# Patient Record
Sex: Female | Born: 1960 | Race: White | Hispanic: No | Marital: Married | Smoking: Never smoker
Health system: Southern US, Community
[De-identification: ages and names within clinical notes are randomized; demographics above are authoritative.]

## PROBLEM LIST (undated history)

## (undated) DIAGNOSIS — I10 Essential (primary) hypertension: Secondary | ICD-10-CM

## (undated) DIAGNOSIS — C801 Malignant (primary) neoplasm, unspecified: Secondary | ICD-10-CM

---

## 2019-11-06 ENCOUNTER — Other Ambulatory Visit: Payer: Self-pay | Admitting: Internal Medicine

## 2019-11-06 DIAGNOSIS — R1319 Other dysphagia: Secondary | ICD-10-CM

## 2019-11-06 DIAGNOSIS — R1310 Dysphagia, unspecified: Secondary | ICD-10-CM

## 2019-11-13 ENCOUNTER — Other Ambulatory Visit: Payer: Self-pay

## 2019-11-13 ENCOUNTER — Ambulatory Visit
Admission: RE | Admit: 2019-11-13 | Discharge: 2019-11-13 | Disposition: A | Discharge status: Home / Self Care | Payer: BC Managed Care – PPO | Source: Ambulatory Visit | Attending: Internal Medicine | Admitting: Internal Medicine

## 2019-11-13 DIAGNOSIS — R1319 Other dysphagia: Secondary | ICD-10-CM

## 2019-11-13 DIAGNOSIS — R1310 Dysphagia, unspecified: Secondary | ICD-10-CM | POA: Insufficient documentation

## 2019-12-17 ENCOUNTER — Other Ambulatory Visit: Payer: Self-pay | Admitting: Internal Medicine

## 2019-12-17 DIAGNOSIS — Z1231 Encounter for screening mammogram for malignant neoplasm of breast: Secondary | ICD-10-CM

## 2020-01-04 LAB — COLOGUARD: COLOGUARD: POSITIVE — AB

## 2020-05-16 ENCOUNTER — Inpatient Hospital Stay: Admission: RE | Admit: 2020-05-16 | Payer: BC Managed Care – PPO | Source: Ambulatory Visit

## 2020-05-18 ENCOUNTER — Encounter: Admission: RE | Payer: Self-pay | Source: Home / Self Care

## 2020-05-18 ENCOUNTER — Ambulatory Visit
Admission: RE | Admit: 2020-05-18 | Payer: BC Managed Care – PPO | Source: Home / Self Care | Admitting: Internal Medicine

## 2020-05-18 SURGERY — COLONOSCOPY WITH PROPOFOL
Anesthesia: General

## 2020-06-30 ENCOUNTER — Other Ambulatory Visit: Payer: Self-pay | Admitting: Internal Medicine

## 2020-06-30 ENCOUNTER — Ambulatory Visit
Admission: RE | Admit: 2020-06-30 | Discharge: 2020-06-30 | Disposition: A | Discharge status: Home / Self Care | Payer: BC Managed Care – PPO | Source: Ambulatory Visit | Attending: Internal Medicine | Admitting: Internal Medicine

## 2020-06-30 ENCOUNTER — Other Ambulatory Visit: Payer: Self-pay

## 2020-06-30 DIAGNOSIS — R195 Other fecal abnormalities: Secondary | ICD-10-CM

## 2020-06-30 DIAGNOSIS — M7918 Myalgia, other site: Secondary | ICD-10-CM | POA: Diagnosis present

## 2020-06-30 DIAGNOSIS — R1011 Right upper quadrant pain: Secondary | ICD-10-CM | POA: Diagnosis present

## 2020-06-30 HISTORY — DX: Malignant (primary) neoplasm, unspecified: C80.1

## 2020-06-30 HISTORY — DX: Essential (primary) hypertension: I10

## 2020-06-30 LAB — POCT I-STAT CREATININE: Creatinine, Ser: 1 mg/dL (ref 0.44–1.00)

## 2020-06-30 MED ORDER — IOHEXOL 300 MG/ML  SOLN
80.0000 mL | Freq: Once | INTRAMUSCULAR | Status: AC | PRN
  Administered 2020-06-30: 80 mL via INTRAVENOUS

## 2020-08-19 ENCOUNTER — Other Ambulatory Visit (HOSPITAL_COMMUNITY): Payer: Self-pay | Admitting: Internal Medicine

## 2020-08-19 ENCOUNTER — Other Ambulatory Visit: Payer: Self-pay | Admitting: Internal Medicine

## 2020-08-19 DIAGNOSIS — R1011 Right upper quadrant pain: Secondary | ICD-10-CM

## 2020-10-20 ENCOUNTER — Other Ambulatory Visit: Payer: Self-pay

## 2020-10-20 ENCOUNTER — Ambulatory Visit
Admission: RE | Admit: 2020-10-20 | Discharge: 2020-10-20 | Disposition: A | Discharge status: Home / Self Care | Payer: BC Managed Care – PPO | Source: Ambulatory Visit | Attending: Internal Medicine | Admitting: Internal Medicine

## 2020-10-20 DIAGNOSIS — R1011 Right upper quadrant pain: Secondary | ICD-10-CM | POA: Insufficient documentation

## 2020-10-20 MED ORDER — TECHNETIUM TC 99M MEBROFENIN IV KIT
5.0000 | PACK | Freq: Once | INTRAVENOUS | Status: AC | PRN
  Administered 2020-10-20: 5.16 via INTRAVENOUS

## 2020-12-12 ENCOUNTER — Ambulatory Visit
Admission: RE | Admit: 2020-12-12 | Discharge: 2020-12-12 | Disposition: A | Discharge status: Home / Self Care | Payer: BC Managed Care – PPO | Source: Ambulatory Visit | Attending: Internal Medicine | Admitting: Internal Medicine

## 2020-12-12 ENCOUNTER — Other Ambulatory Visit: Payer: Self-pay

## 2020-12-12 DIAGNOSIS — Z1231 Encounter for screening mammogram for malignant neoplasm of breast: Secondary | ICD-10-CM | POA: Diagnosis present

## 2021-09-03 IMAGING — MG MM DIGITAL SCREENING BILAT W/ TOMO AND CAD
8 series · 8 of 24 positions shown · non-contrast
Comparison: Previous exam(s).

CLINICAL DATA: Screening.

EXAM:
DIGITAL SCREENING BILATERAL MAMMOGRAM WITH TOMOSYNTHESIS AND CAD
TECHNIQUE: Bilateral screening digital craniocaudal and mediolateral oblique
mammograms were obtained. Bilateral screening digital breast
tomosynthesis was performed. The images were evaluated with
computer-aided detection.

[R MLO synth-2D]
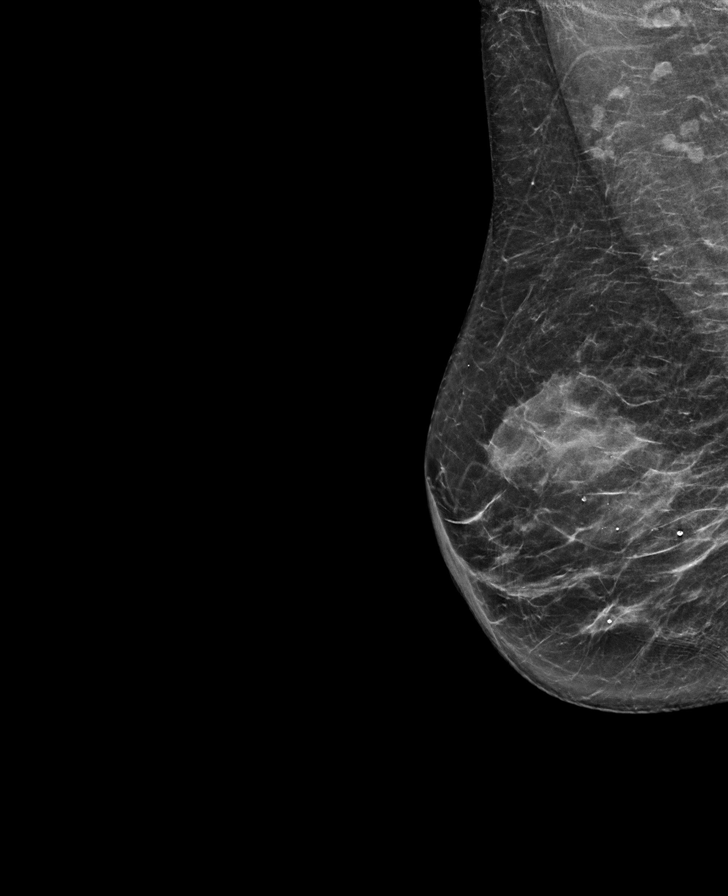

[R CC synth-2D]
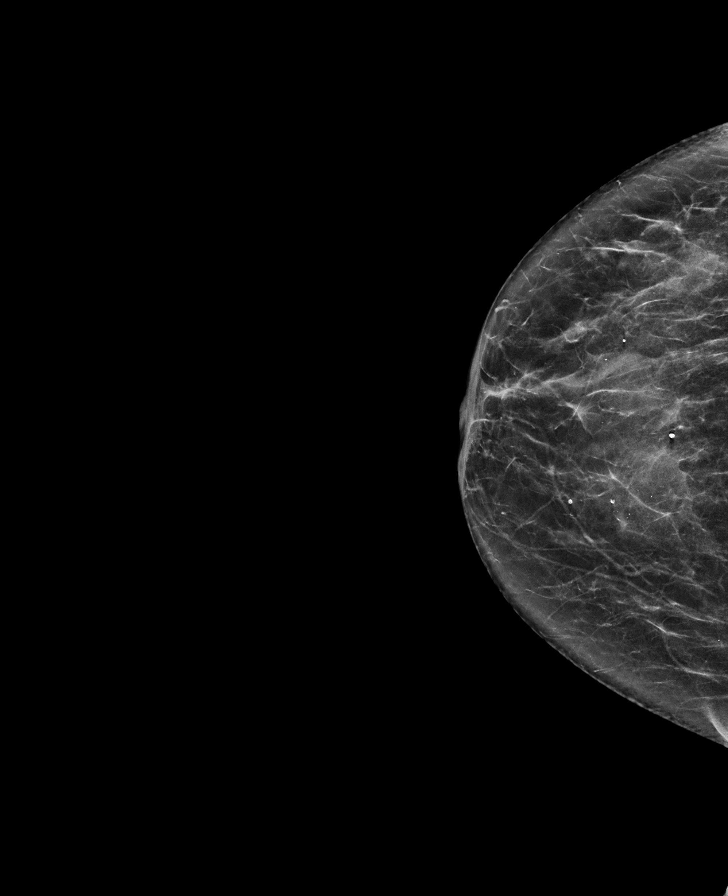

[L CC synth-2D]
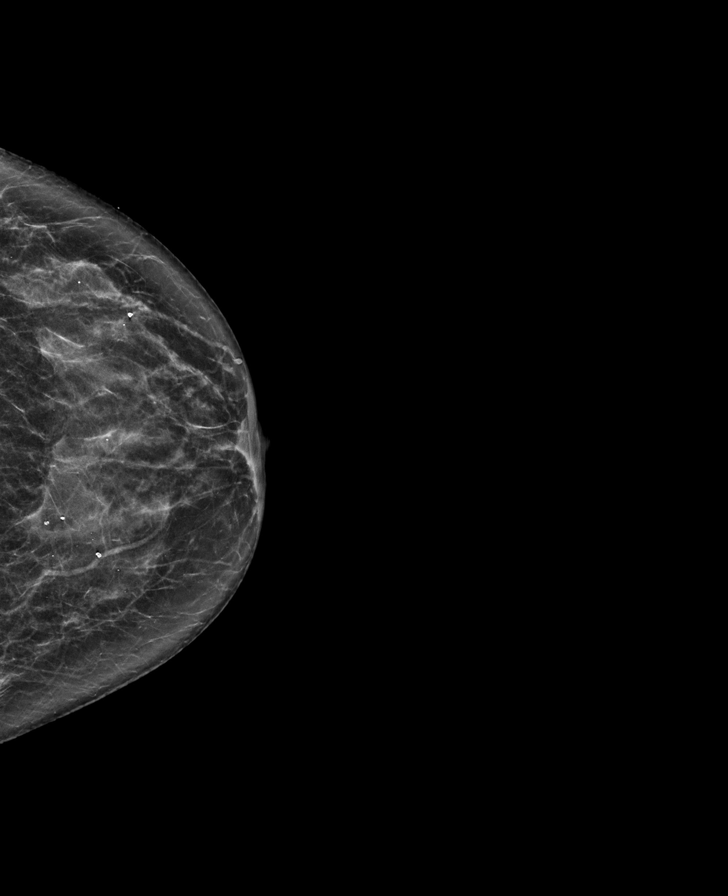

[L MLO synth-2D]
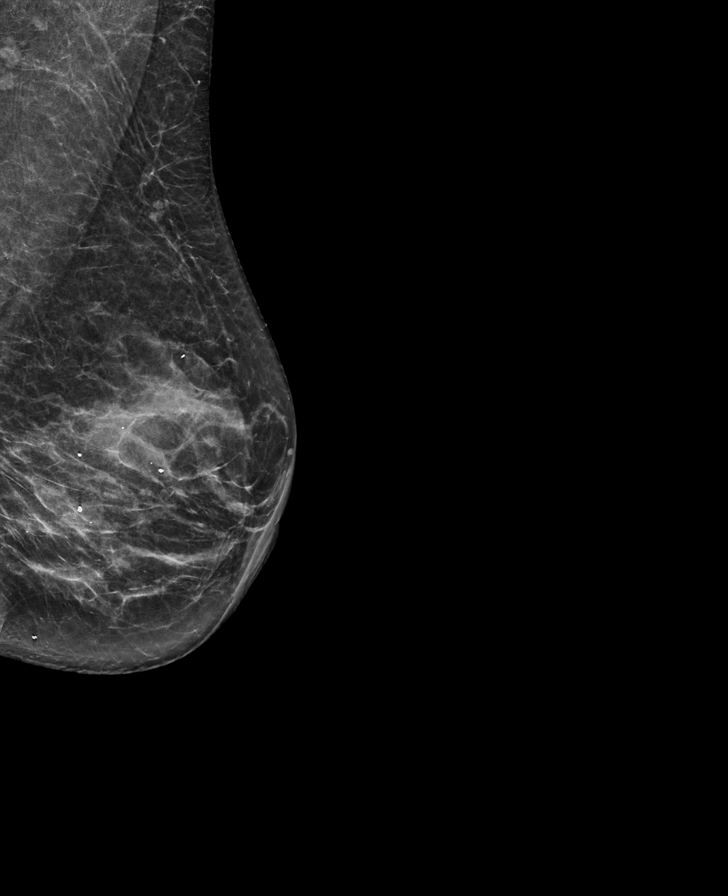

[R CC tomo · tomo slice 32/63.0]
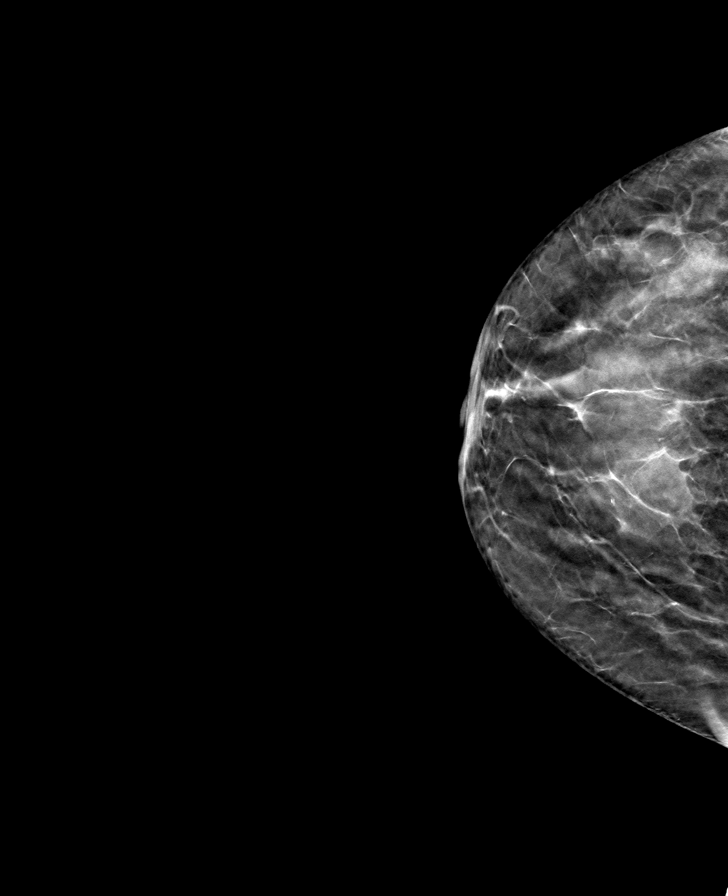

[L MLO tomo · tomo slice 33/66.0]
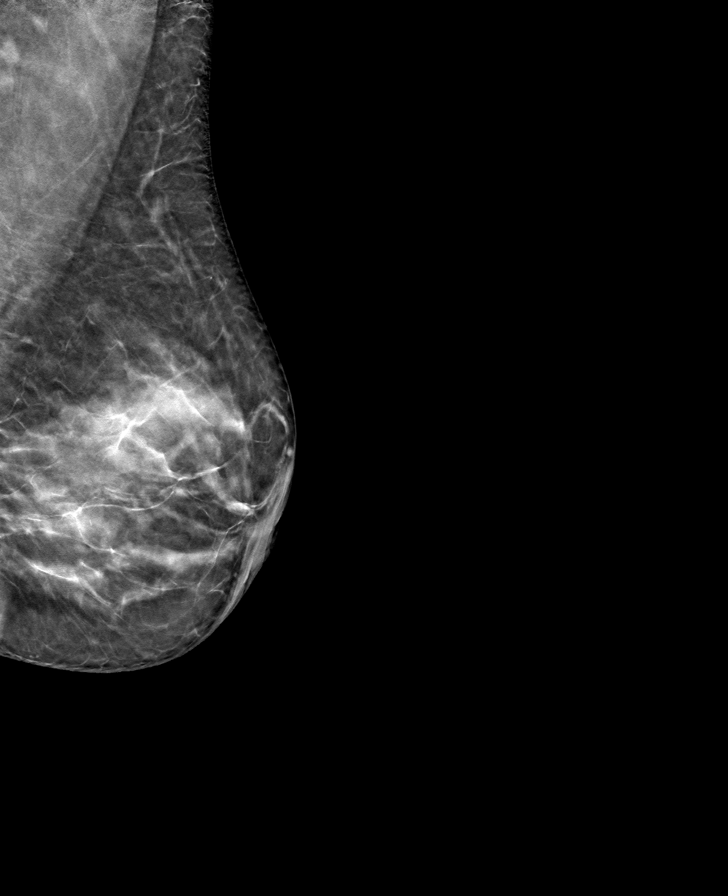

[R MLO tomo · tomo slice 35/69.0]
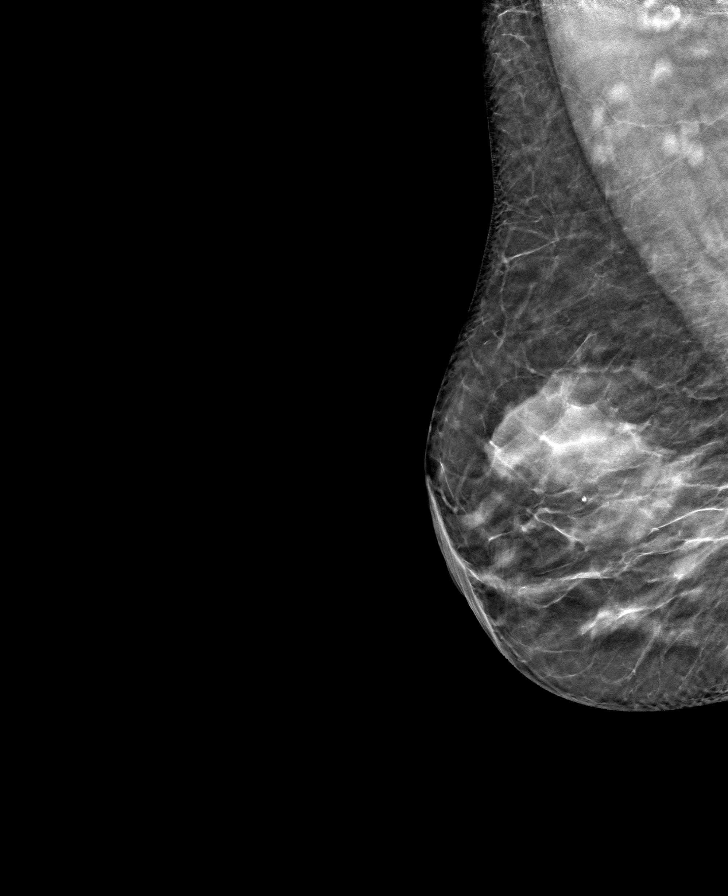

[L CC tomo · tomo slice 32/63.0]
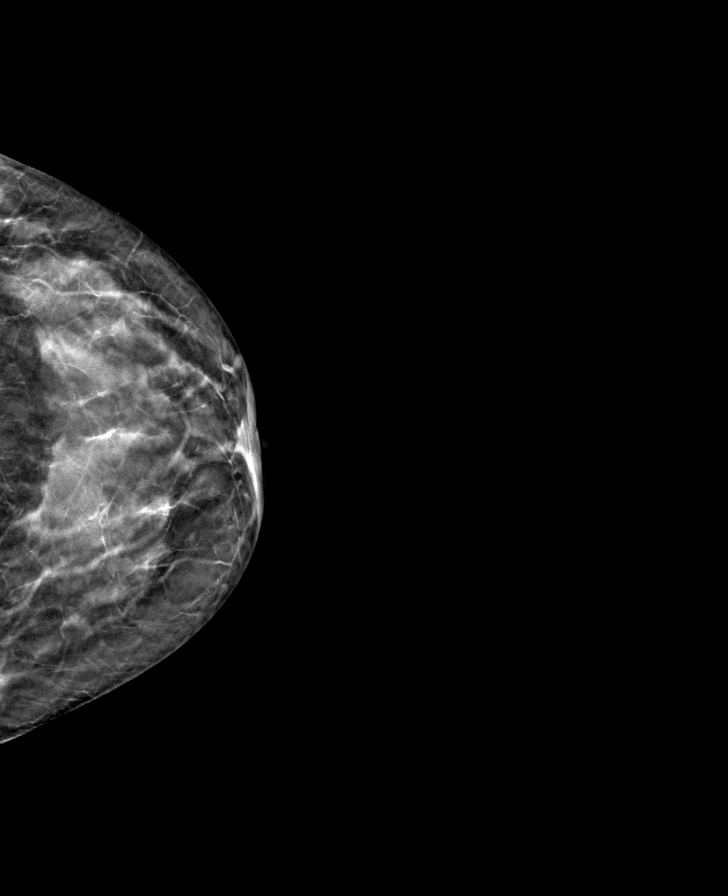

[8 of 24 positions shown; findings below may reference images not displayed]

ACR Breast Density Category c: The breast tissue is heterogeneously
dense, which may obscure small masses.
FINDINGS: There are no findings suspicious for malignancy.
IMPRESSION: No mammographic evidence of malignancy. A result letter of this
screening mammogram will be mailed directly to the patient.

RECOMMENDATION:
Screening mammogram in one year. (Code:Q3-W-BC3)

BI-RADS CATEGORY  1: Negative.

## 2021-10-24 ENCOUNTER — Encounter: Payer: Self-pay | Admitting: Internal Medicine

## 2021-10-25 ENCOUNTER — Encounter
Admission: RE | Disposition: A | Discharge status: Home / Self Care | Payer: Self-pay | Source: Home / Self Care | Attending: Internal Medicine

## 2021-10-25 ENCOUNTER — Encounter: Payer: Self-pay | Admitting: Internal Medicine

## 2021-10-25 ENCOUNTER — Ambulatory Visit: Admit: 2021-10-25 | Payer: Self-pay | Admitting: Internal Medicine

## 2021-10-25 ENCOUNTER — Ambulatory Visit: Payer: Commercial Managed Care - PPO | Admitting: Certified Registered Nurse Anesthetist

## 2021-10-25 ENCOUNTER — Ambulatory Visit: Admit: 2021-10-25 | Payer: BC Managed Care – PPO | Admitting: Internal Medicine

## 2021-10-25 ENCOUNTER — Ambulatory Visit
Admission: RE | Admit: 2021-10-25 | Discharge: 2021-10-25 | Disposition: A | Discharge status: Home / Self Care | Payer: Commercial Managed Care - PPO | Attending: Internal Medicine | Admitting: Internal Medicine

## 2021-10-25 DIAGNOSIS — I2510 Atherosclerotic heart disease of native coronary artery without angina pectoris: Secondary | ICD-10-CM | POA: Diagnosis not present

## 2021-10-25 DIAGNOSIS — I10 Essential (primary) hypertension: Secondary | ICD-10-CM | POA: Insufficient documentation

## 2021-10-25 DIAGNOSIS — K2100 Gastro-esophageal reflux disease with esophagitis, without bleeding: Secondary | ICD-10-CM | POA: Insufficient documentation

## 2021-10-25 DIAGNOSIS — D123 Benign neoplasm of transverse colon: Secondary | ICD-10-CM | POA: Insufficient documentation

## 2021-10-25 DIAGNOSIS — K641 Second degree hemorrhoids: Secondary | ICD-10-CM | POA: Insufficient documentation

## 2021-10-25 DIAGNOSIS — K2970 Gastritis, unspecified, without bleeding: Secondary | ICD-10-CM | POA: Diagnosis not present

## 2021-10-25 DIAGNOSIS — K5730 Diverticulosis of large intestine without perforation or abscess without bleeding: Secondary | ICD-10-CM | POA: Diagnosis not present

## 2021-10-25 DIAGNOSIS — Z85118 Personal history of other malignant neoplasm of bronchus and lung: Secondary | ICD-10-CM | POA: Diagnosis not present

## 2021-10-25 DIAGNOSIS — K449 Diaphragmatic hernia without obstruction or gangrene: Secondary | ICD-10-CM | POA: Diagnosis not present

## 2021-10-25 DIAGNOSIS — R195 Other fecal abnormalities: Secondary | ICD-10-CM | POA: Diagnosis not present

## 2021-10-25 DIAGNOSIS — Z1211 Encounter for screening for malignant neoplasm of colon: Secondary | ICD-10-CM | POA: Diagnosis present

## 2021-10-25 DIAGNOSIS — D120 Benign neoplasm of cecum: Secondary | ICD-10-CM | POA: Insufficient documentation

## 2021-10-25 DIAGNOSIS — Z955 Presence of coronary angioplasty implant and graft: Secondary | ICD-10-CM | POA: Diagnosis not present

## 2021-10-25 HISTORY — PX: COLONOSCOPY WITH PROPOFOL: SHX5780

## 2021-10-25 HISTORY — PX: ESOPHAGOGASTRODUODENOSCOPY: SHX5428

## 2021-10-25 SURGERY — COLONOSCOPY
Anesthesia: General

## 2021-10-25 SURGERY — EGD (ESOPHAGOGASTRODUODENOSCOPY)
Anesthesia: General

## 2021-10-25 MED ORDER — DEXAMETHASONE SODIUM PHOSPHATE 10 MG/ML IJ SOLN
INTRAMUSCULAR | Status: AC
  Filled 2021-10-25: qty 1

## 2021-10-25 MED ORDER — SODIUM CHLORIDE 0.9 % IV SOLN
INTRAVENOUS | Status: DC
  Administered 2021-10-25: 20 mL/h via INTRAVENOUS

## 2021-10-25 MED ORDER — ONDANSETRON HCL 4 MG/2ML IJ SOLN
INTRAMUSCULAR | Status: AC
  Filled 2021-10-25: qty 2

## 2021-10-25 MED ORDER — PROPOFOL 10 MG/ML IV BOLUS
INTRAVENOUS | Status: DC | PRN
  Administered 2021-10-25: 20 mg via INTRAVENOUS
  Administered 2021-10-25: 60 mg via INTRAVENOUS

## 2021-10-25 MED ORDER — PROPOFOL 500 MG/50ML IV EMUL
INTRAVENOUS | Status: DC | PRN
  Administered 2021-10-25: 150 ug/kg/min via INTRAVENOUS

## 2021-10-25 MED ORDER — PROPOFOL 10 MG/ML IV BOLUS
INTRAVENOUS | Status: AC
  Filled 2021-10-25: qty 20

## 2021-10-25 MED ORDER — LIDOCAINE HCL (CARDIAC) PF 100 MG/5ML IV SOSY
PREFILLED_SYRINGE | INTRAVENOUS | Status: DC | PRN
  Administered 2021-10-25: 50 mg via INTRAVENOUS

## 2021-10-25 MED ORDER — LIDOCAINE HCL (PF) 2 % IJ SOLN
INTRAMUSCULAR | Status: AC
  Filled 2021-10-25: qty 5

## 2021-10-25 NOTE — Anesthesia Procedure Notes (Signed)
Procedure Name: MAC Date/Time: 10/25/2021 10:17 AM  Performed by: Tollie Eth, CRNAPre-anesthesia Checklist: Patient identified, Emergency Drugs available, Suction available and Patient being monitored Patient Re-evaluated:Patient Re-evaluated prior to induction Oxygen Delivery Method: Nasal cannula Induction Type: IV induction Placement Confirmation: positive ETCO2

## 2021-10-25 NOTE — Interval H&P Note (Signed)
History and Physical Interval Note:  10/25/2021 10:18 AM  Kelly Newman  has presented today for surgery, with the diagnosis of [place] LUQ PAIN ABDOMINAL PAIN GERD.  The various methods of treatment have been discussed with the patient and family. After consideration of risks, benefits and other options for treatment, the patient has consented to  Procedure(s) with comments: ESOPHAGOGASTRODUODENOSCOPY (EGD) (N/A) - REQUEST EARLY AM COLONOSCOPY WITH PROPOFOL (N/A) as a surgical intervention.  The patient's history has been reviewed, patient examined, no change in status, stable for surgery.  I have reviewed the patient's chart and labs.  Questions were answered to the patient's satisfaction.     [place], [place]

## 2021-10-25 NOTE — Op Note (Signed)
[facility] Gastroenterology Patient Name: Kelly Newman Procedure Date: 10/25/2021 10:26 AM MRN: [id number] Account #: [id number] Date of Birth: [birth year] Admit Type: Outpatient Age: 61 Room: [facility] ENDO ROOM 2 Gender: Female Note Status: Finalized Instrument Name: Michaelle Birks [id number] Procedure:             Upper GI endoscopy Indications:           Abdominal pain in the left upper quadrant,                         Gastro-esophageal reflux disease Providers:             Benay Pike. Alice Reichert MD, MD Referring MD:          Gladstone Lighter, MD (Referring MD) Medicines:             Propofol per Anesthesia Complications:         No immediate complications. Procedure:             Pre-Anesthesia Assessment:                        - The risks and benefits of the procedure and the                         sedation options and risks were discussed with the                         patient. All questions were answered and informed                         consent was obtained.                        - Patient identification and proposed procedure were                         verified prior to the procedure by the nurse. The                         procedure was verified in the procedure room.                        - ASA Grade Assessment: III - A patient with severe                         systemic disease.                        - After reviewing the risks and benefits, the patient                         was deemed in satisfactory condition to undergo the                         procedure.                        After obtaining informed consent, the endoscope was                         passed  under direct vision. Throughout the procedure,                         the patient's blood pressure, pulse, and oxygen                         saturations were monitored continuously. The Endoscope                         was introduced through the mouth, and advanced to the                          third part of duodenum. The upper GI endoscopy was                         accomplished without difficulty. The patient tolerated                         the procedure well. Findings:      The esophagus was normal.      Patchy mild inflammation characterized by erosions and erythema was       found in the gastric antrum. Biopsies were taken with a cold forceps for       Helicobacter pylori testing.      A 2 cm hiatal hernia was present.      The examined duodenum was normal.      The exam was otherwise without abnormality. Impression:            - Normal esophagus.                        - Gastritis. Biopsied.                        - 2 cm hiatal hernia.                        - Normal examined duodenum.                        - The examination was otherwise normal. Recommendation:        - Await pathology results.                        - Proceed with colonoscopy Procedure Code(s):     --- Professional ---                        [phone], Esophagogastroduodenoscopy, flexible,                         transoral; with biopsy, single or multiple Diagnosis Code(s):     --- Professional ---                        K21.9, Gastro-esophageal reflux disease without                         esophagitis                        R10.12, Left upper quadrant pain  K44.9, Diaphragmatic hernia without obstruction or                         gangrene                        K29.70, Gastritis, unspecified, without bleeding CPT copyright 2019 American Medical Association. All rights reserved. The codes documented in this report are preliminary and upon coder review may  be revised to meet current compliance requirements. Efrain Sella MD, MD 10/25/2021 10:47:47 AM This report has been signed electronically. Number of Addenda: 0 Note Initiated On: 10/25/2021 10:26 AM Estimated Blood Loss:  Estimated blood loss: none.      [facility]

## 2021-10-25 NOTE — Transfer of Care (Signed)
Immediate Anesthesia Transfer of Care Note  Patient: Kelly Newman  Procedure(s) Performed: ESOPHAGOGASTRODUODENOSCOPY (EGD) COLONOSCOPY WITH PROPOFOL  Patient Location: Endoscopy Unit  Anesthesia Type:General  Level of Consciousness: awake, alert  and oriented  Airway & Oxygen Therapy: Patient Spontanous Breathing  Post-op Assessment: Report given to RN and Post -op Vital signs reviewed and stable  Post vital signs: Reviewed and stable  Last Vitals:  Vitals Value Taken Time  BP 96/57 10/25/21 1114  Temp 35.9 C 10/25/21 1113  Pulse 65 10/25/21 1114  Resp 18 10/25/21 1114  SpO2 100 % 10/25/21 1114    Last Pain:  Vitals:   10/25/21 1114  TempSrc:   PainSc: 0-No pain         Complications: No notable events documented.

## 2021-10-25 NOTE — Anesthesia Preprocedure Evaluation (Signed)
Anesthesia Evaluation  Patient identified by MRN, date of birth, ID band Patient awake    Reviewed: Allergy & Precautions, NPO status , Patient's Chart, lab work & pertinent test results  History of Anesthesia Complications Negative for: history of anesthetic complications  Airway Mallampati: III  TM Distance: <3 FB Neck ROM: Full    Dental  (+) Teeth Intact   Pulmonary neg sleep apnea, neg COPD, Patient abstained from smoking.Not current smoker,  Lung cancer s/p LLL resection   Pulmonary exam normal breath sounds clear to auscultation       Cardiovascular Exercise Tolerance: Good METShypertension, Pt. on medications + CAD and + Cardiac Stents  (-) Past MI + dysrhythmias Atrial Fibrillation  Rhythm:Regular Rate:Normal - Systolic murmurs    Neuro/Psych negative neurological ROS  negative psych ROS   GI/Hepatic GERD  Medicated and Controlled,(+)     (-) substance abuse  ,   Endo/Other  neg diabetes  Renal/GU negative Renal ROS     Musculoskeletal   Abdominal   Peds  Hematology   Anesthesia Other Findings Past Medical History: No date: Cancer ([place])     Comment:  lung cancer No date: Hypertension  Reproductive/Obstetrics                             Anesthesia Physical Anesthesia Plan  ASA: 3  Anesthesia Plan: General   Post-op Pain Management: Minimal or no pain anticipated   Induction: Intravenous  PONV Risk Score and Plan: 3 and Propofol infusion, TIVA and Ondansetron  Airway Management Planned: Nasal Cannula  Additional Equipment: None  Intra-op Plan:   Post-operative Plan:   Informed Consent: I have reviewed the patients History and Physical, chart, labs and discussed the procedure including the risks, benefits and alternatives for the proposed anesthesia with the patient or authorized representative who has indicated his/her understanding and acceptance.      Dental advisory given  Plan Discussed with: CRNA and Surgeon  Anesthesia Plan Comments: (Discussed risks of anesthesia with patient, including possibility of difficulty with spontaneous ventilation under anesthesia necessitating airway intervention, PONV, and rare risks such as cardiac or respiratory or neurological events, and allergic reactions. Discussed the role of CRNA in patient's perioperative care. Patient understands.)        Anesthesia Quick Evaluation

## 2021-10-25 NOTE — Anesthesia Postprocedure Evaluation (Signed)
Anesthesia Post Note  Patient: Chester Romero  Procedure(s) Performed: ESOPHAGOGASTRODUODENOSCOPY (EGD) COLONOSCOPY WITH PROPOFOL  Patient location during evaluation: Endoscopy Anesthesia Type: General Level of consciousness: awake and alert Pain management: pain level controlled Vital Signs Assessment: post-procedure vital signs reviewed and stable Respiratory status: spontaneous breathing, nonlabored ventilation, respiratory function stable and patient connected to nasal cannula oxygen Cardiovascular status: blood pressure returned to baseline and stable Postop Assessment: no apparent nausea or vomiting Anesthetic complications: no   No notable events documented.   Last Vitals:  Vitals:   10/25/21 1123 10/25/21 1133  BP: (!) 111/58 127/62  Pulse: 65 (!) 58  Resp: 13 14  Temp:    SpO2: 100% 100%    Last Pain:  Vitals:   10/25/21 1133  TempSrc:   PainSc: 0-No pain                 Arita Miss

## 2021-10-25 NOTE — Op Note (Signed)
[facility] Gastroenterology Patient Name: Kelly Newman Procedure Date: 10/25/2021 10:19 AM MRN: [id number] Account #: [id number] Date of Birth: 09-30-1960 Admit Type: Outpatient Age: 61 Room: [facility] ENDO ROOM 2 Gender: Female Note Status: Finalized Instrument Name: Jasper Riling [id number] Procedure:             Colonoscopy Indications:           Positive Cologuard test Providers:             Benay Pike. Alice Reichert MD, MD Referring MD:          Gladstone Lighter, MD (Referring MD) Medicines:             Propofol per Anesthesia Complications:         No immediate complications. Procedure:             Pre-Anesthesia Assessment:                        - The risks and benefits of the procedure and the                         sedation options and risks were discussed with the                         patient. All questions were answered and informed                         consent was obtained.                        - Patient identification and proposed procedure were                         verified prior to the procedure by the nurse. The                         procedure was verified in the procedure room.                        - ASA Grade Assessment: III - A patient with severe                         systemic disease.                        - After reviewing the risks and benefits, the patient                         was deemed in satisfactory condition to undergo the                         procedure.                        After obtaining informed consent, the colonoscope was                         passed under direct vision. Throughout the procedure,                         the patient's blood  pressure, pulse, and oxygen                         saturations were monitored continuously. The                         Colonoscope was introduced through the anus and                         advanced to the the cecum, identified by appendiceal                          orifice and ileocecal valve. The colonoscopy was                         performed without difficulty. The patient tolerated                         the procedure well. The quality of the bowel                         preparation was excellent. The ileocecal valve,                         appendiceal orifice, and rectum were photographed. Findings:      The perianal exam findings include internal hemorrhoids that prolapse       with straining, but spontaneously regress to the resting position (Grade       II).      Non-bleeding internal hemorrhoids were found during retroflexion. The       hemorrhoids were Grade II (internal hemorrhoids that prolapse but reduce       spontaneously).      Many small and large-mouthed diverticula were found in the entire colon.       There was no evidence of diverticular bleeding.      A 9 mm polyp was found in the cecum. The polyp was sessile. The polyp       was removed with a hot snare. Resection and retrieval were complete. To       prevent bleeding after the polypectomy, one hemostatic clip was       successfully placed (MR conditional). There was no bleeding during, or       at the end, of the procedure.      A 10 mm polyp was found in the transverse colon. The polyp was sessile.       The polyp was removed with a hot snare. Resection and retrieval were       complete. To prevent bleeding after the polypectomy, one hemostatic clip       was successfully placed (MR conditional). There was no bleeding during,       or at the end, of the procedure.      The exam was otherwise without abnormality. Impression:            - Internal hemorrhoids that prolapse with straining,                         but spontaneously regress to the resting position                         (  Grade II) found on perianal exam.                        - Non-bleeding internal hemorrhoids.                        - Mild diverticulosis in the entire examined colon.                          There was no evidence of diverticular bleeding.                        - One 9 mm polyp in the cecum, removed with a hot                         snare. Resected and retrieved. Clip (MR conditional)                         was placed.                        - One 10 mm polyp in the transverse colon, removed                         with a hot snare. Resected and retrieved. Clip (MR                         conditional) was placed.                        - The examination was otherwise normal. Recommendation:        - Await pathology results from EGD, also performed                         today.                        - IF biopsies for H. Pylori are negative, advise                         surgical referral for cholecystectomy.                        - Patient has a contact number available for                         emergencies. The signs and symptoms of potential                         delayed complications were discussed with the patient.                         Return to normal activities tomorrow. Written                         discharge instructions were provided to the patient.                        - Resume previous diet.                        -  Continue present medications.                        - Resume Eliquis (apixaban) at prior dose today. Refer                         to managing physician for further adjustment of                         therapy.                        - Repeat colonoscopy is recommended for surveillance.                         The colonoscopy date will be determined after                         pathology results from today's exam become available                         for review.                        - Return to GI office at the next available                         appointment.                        - Telephone GI office to schedule appointment.                        - The findings and recommendations were discussed with                          the patient. Procedure Code(s):     --- Professional ---                        [phone], Colonoscopy, flexible; with removal of                         tumor(s), polyp(s), or other lesion(s) by snare                         technique Diagnosis Code(s):     --- Professional ---                        K57.30, Diverticulosis of large intestine without                         perforation or abscess without bleeding                        R19.5, Other fecal abnormalities                        K63.5, Polyp of colon                        K64.1, Second degree hemorrhoids CPT copyright 2019 American  Medical Association. All rights reserved. The codes documented in this report are preliminary and upon coder review may  be revised to meet current compliance requirements. Efrain Sella MD, MD 10/25/2021 11:18:02 AM This report has been signed electronically. Number of Addenda: 0 Note Initiated On: 10/25/2021 10:19 AM Scope Withdrawal Time: 0 hours 12 minutes 51 seconds  Total Procedure Duration: 0 hours 18 minutes 29 seconds  Estimated Blood Loss:  Estimated blood loss: none.      [facility]

## 2021-10-25 NOTE — H&P (Signed)
Outpatient short stay form Pre-procedure 10/25/2021 10:16 AM Kelly Newman K. Alice Reichert, M.D.  Primary Physician: Gladstone Lighter, M.D.  Reason for visit:  LUQ pain, GERD, positive Cologuard test.  History of present illness:   61 y/o female with recurrent LUQ pain, GERD symptoms without dysphagia. Patient is a pleasant 61 y/o female presenting on referral from primary care provider for a POSITIVE Cologuard result. Patient denies change in bowel habits, rectal bleeding, weight loss or abdominal pain.      Current Facility-Administered Medications:    0.9 %  sodium chloride infusion, , Intravenous, Continuous, Maliyah Willets, Benay Pike, MD  Medications Prior to Admission  Medication Sig Dispense Refill Last Dose   ALPRAZolam (XANAX) 0.5 MG tablet Take 0.5 mg by mouth at bedtime as needed for anxiety.   10/24/2021   apixaban (ELIQUIS) 5 MG TABS tablet Take 5 mg by mouth 2 (two) times daily.   Past Week   aspirin EC 81 MG tablet Take 81 mg by mouth daily. Swallow whole.   10/24/2021   betamethasone dipropionate (DIPROLENE) 0.05 % ointment Apply topically 2 (two) times daily.   10/24/2021   Cholecalciferol (VITAMIN D3) 1.25 MG (50000 UT) CAPS Take 2,000 Units by mouth daily.   10/24/2021   diltiazem (DILACOR XR) 180 MG 24 hr capsule Take 180 mg by mouth daily.   10/24/2021   erlotinib (TARCEVA) 100 MG tablet Take 100 mg by mouth daily. Take on an empty stomach 1 hour before meals or 2 hours after   10/24/2021   famotidine (PEPCID) 20 MG tablet Take 20 mg by mouth 2 (two) times daily.   10/24/2021   ranolazine (RANEXA) 500 MG 12 hr tablet Take 500 mg by mouth 2 (two) times daily.   10/24/2021   rosuvastatin (CRESTOR) 40 MG tablet Take 40 mg by mouth daily.   10/24/2021   sotalol (BETAPACE) 80 MG tablet Take 80 mg by mouth 2 (two) times daily.   10/24/2021   vitamin B-12 (CYANOCOBALAMIN) 100 MCG tablet Take 1,000 mcg by mouth daily.   10/24/2021     Not on File   Past Medical History:  Diagnosis Date   Cancer  [facility])    lung cancer   Hypertension     Review of systems:  Otherwise negative.    Physical Exam  Gen: Alert, oriented. Appears stated age.  HEENT: [place]/AT. PERRLA. Lungs: CTA, no wheezes. CV: RR nl S1, S2. Abd: soft, benign, no masses. BS+ Ext: No edema. Pulses 2+    Planned procedures: Proceed with EGD and colonoscopy. The patient understands the nature of the planned procedure, indications, risks, alternatives and potential complications including but not limited to bleeding, infection, perforation, damage to internal organs and possible oversedation/side effects from anesthesia. The patient agrees and gives consent to proceed.  Please refer to procedure notes for findings, recommendations and patient disposition/instructions.     Milind Raether K. Alice Reichert, M.D. Gastroenterology 10/25/2021  10:16 AM

## 2021-10-26 LAB — SURGICAL PATHOLOGY

## 2021-11-30 ENCOUNTER — Other Ambulatory Visit: Payer: Self-pay | Admitting: Internal Medicine

## 2021-11-30 DIAGNOSIS — R1011 Right upper quadrant pain: Secondary | ICD-10-CM

## 2021-12-07 ENCOUNTER — Ambulatory Visit
Admission: RE | Admit: 2021-12-07 | Discharge: 2021-12-07 | Disposition: A | Discharge status: Home / Self Care | Payer: Commercial Managed Care - PPO | Source: Ambulatory Visit | Attending: Internal Medicine | Admitting: Internal Medicine

## 2021-12-07 DIAGNOSIS — R1011 Right upper quadrant pain: Secondary | ICD-10-CM

## 2021-12-13 ENCOUNTER — Encounter: Payer: Self-pay | Admitting: Internal Medicine

## 2022-02-02 ENCOUNTER — Other Ambulatory Visit: Payer: Self-pay | Admitting: Internal Medicine

## 2022-02-02 DIAGNOSIS — Z1231 Encounter for screening mammogram for malignant neoplasm of breast: Secondary | ICD-10-CM

## 2022-03-27 ENCOUNTER — Ambulatory Visit
Admission: RE | Admit: 2022-03-27 | Discharge: 2022-03-27 | Disposition: A | Discharge status: Home / Self Care | Payer: Commercial Managed Care - PPO | Source: Ambulatory Visit | Attending: Internal Medicine | Admitting: Internal Medicine

## 2022-03-27 DIAGNOSIS — Z1231 Encounter for screening mammogram for malignant neoplasm of breast: Secondary | ICD-10-CM | POA: Diagnosis not present

## 2023-03-05 ENCOUNTER — Other Ambulatory Visit: Payer: Self-pay | Admitting: Internal Medicine

## 2023-03-05 DIAGNOSIS — Z1231 Encounter for screening mammogram for malignant neoplasm of breast: Secondary | ICD-10-CM

## 2023-04-02 ENCOUNTER — Ambulatory Visit: Payer: Commercial Managed Care - PPO

## 2023-04-12 ENCOUNTER — Ambulatory Visit
Admission: RE | Admit: 2023-04-12 | Discharge: 2023-04-12 | Disposition: A | Discharge status: Home / Self Care | Payer: Commercial Managed Care - PPO | Source: Ambulatory Visit | Attending: Internal Medicine | Admitting: Internal Medicine

## 2023-04-12 DIAGNOSIS — Z1231 Encounter for screening mammogram for malignant neoplasm of breast: Secondary | ICD-10-CM | POA: Diagnosis present

## 2024-03-16 ENCOUNTER — Other Ambulatory Visit: Payer: Self-pay | Admitting: Internal Medicine

## 2024-03-16 DIAGNOSIS — Z1231 Encounter for screening mammogram for malignant neoplasm of breast: Secondary | ICD-10-CM

## 2024-04-24 ENCOUNTER — Ambulatory Visit
Admission: RE | Admit: 2024-04-24 | Discharge: 2024-04-24 | Disposition: A | Source: Ambulatory Visit | Attending: Internal Medicine | Admitting: Internal Medicine

## 2024-04-24 DIAGNOSIS — Z1231 Encounter for screening mammogram for malignant neoplasm of breast: Secondary | ICD-10-CM | POA: Diagnosis present
